# Patient Record
Sex: Female | Born: 1950 | Race: White | Hispanic: No | Marital: Married | Smoking: Never smoker
Health system: Southern US, Community
[De-identification: ages and names within clinical notes are randomized; demographics above are authoritative.]

## PROBLEM LIST (undated history)

## (undated) HISTORY — PX: EYE SURGERY: SHX253

---

## 2019-08-06 ENCOUNTER — Other Ambulatory Visit: Payer: Self-pay

## 2019-08-06 ENCOUNTER — Emergency Department (HOSPITAL_COMMUNITY)
Admission: EM | Admit: 2019-08-06 | Discharge: 2019-08-06 | Disposition: A | Discharge status: Home / Self Care | Payer: Medicare Other | Attending: Emergency Medicine | Admitting: Emergency Medicine

## 2019-08-06 ENCOUNTER — Emergency Department (HOSPITAL_COMMUNITY): Payer: Medicare Other

## 2019-08-06 ENCOUNTER — Encounter (HOSPITAL_COMMUNITY): Payer: Self-pay | Admitting: *Deleted

## 2019-08-06 DIAGNOSIS — W108XXA Fall (on) (from) other stairs and steps, initial encounter: Secondary | ICD-10-CM | POA: Insufficient documentation

## 2019-08-06 DIAGNOSIS — M25551 Pain in right hip: Secondary | ICD-10-CM | POA: Diagnosis not present

## 2019-08-06 DIAGNOSIS — Y93E2 Activity, laundry: Secondary | ICD-10-CM | POA: Diagnosis not present

## 2019-08-06 DIAGNOSIS — M545 Low back pain: Secondary | ICD-10-CM | POA: Diagnosis not present

## 2019-08-06 DIAGNOSIS — Y92008 Other place in unspecified non-institutional (private) residence as the place of occurrence of the external cause: Secondary | ICD-10-CM | POA: Insufficient documentation

## 2019-08-06 DIAGNOSIS — Y999 Unspecified external cause status: Secondary | ICD-10-CM | POA: Insufficient documentation

## 2019-08-06 DIAGNOSIS — S0181XA Laceration without foreign body of other part of head, initial encounter: Secondary | ICD-10-CM | POA: Diagnosis not present

## 2019-08-06 DIAGNOSIS — Z23 Encounter for immunization: Secondary | ICD-10-CM | POA: Insufficient documentation

## 2019-08-06 DIAGNOSIS — W19XXXA Unspecified fall, initial encounter: Secondary | ICD-10-CM

## 2019-08-06 DIAGNOSIS — S0003XA Contusion of scalp, initial encounter: Secondary | ICD-10-CM

## 2019-08-06 MED ORDER — BACITRACIN ZINC 500 UNIT/GM EX OINT
TOPICAL_OINTMENT | Freq: Two times a day (BID) | CUTANEOUS | Status: DC
  Administered 2019-08-06: 1 via TOPICAL
  Filled 2019-08-06: qty 0.9

## 2019-08-06 MED ORDER — ACETAMINOPHEN 325 MG PO TABS
650.0000 mg | ORAL_TABLET | Freq: Once | ORAL | Status: AC
  Administered 2019-08-06: 650 mg via ORAL
  Filled 2019-08-06: qty 2

## 2019-08-06 MED ORDER — METHOCARBAMOL 500 MG PO TABS: 500.0000 mg | ORAL_TABLET | Freq: Three times a day (TID) | ORAL | 0 refills | Status: AC

## 2019-08-06 MED ORDER — TETANUS-DIPHTH-ACELL PERTUSSIS 5-2.5-18.5 LF-MCG/0.5 IM SUSP
0.5000 mL | Freq: Once | INTRAMUSCULAR | Status: AC
  Administered 2019-08-06: 0.5 mL via INTRAMUSCULAR
  Filled 2019-08-06: qty 0.5

## 2019-08-06 NOTE — ED Triage Notes (Signed)
Pt tripped going down her stairs and tumbled down 9 stairs.  No loc.  Denies cervical tenderness.  C-collar placed in triage.  Multiple hematomas to head and c/o R lower back pain. PERRL.

## 2019-08-06 NOTE — Discharge Instructions (Addendum)
You were seen in the ER for a fall.  You have several contusions on your scalp and forehead.  You have a small area of bruising in the lower back.  Scans today and x-rays done were normal.  Anticipate some generalized soreness from a fall tomorrow.  Alternate ibuprofen or acetaminophen every 6-8 hours as needed.  Ice.  Have given you prescription for a muscle relaxer that you can use for any associated catching or spasms in your muscle.  You have a small laceration on your forehead.  This was already well adhered and attached together.  There was no need for sutures.  Keep this wound clean and dry, monitor for signs of infection including redness, warmth, fever, pus.  We discussed possibility of concussion.  Any person with a head injury is at risk of developing a concussion.  Read attached information on concussion.   Concussion symptoms include include headache, nausea, sleep disturbance, dizziness, short term memory loss, vision disturbances, nausea, vomiting, light sensitivity.  Symptom may persist and slowly improve in 2-3 weeks, usually they resolve sooner in less than 1 week. These symptoms can wax and wane, and can be worsened by physical or cognitive exertion like reading, watching TV, studying, using computer, playing video games.   Take acetaminophen or ibuprofen for associated pain. Ice your scalp. Ondansetron 4 mg for nausea, as needed.    We recommend physical and cognitive rest for the next 48-72 hours. This means avoid any heavy exertional or cognitive (brain) activity during this time, that may worsen your symptoms, including reading, computer use, playing video games, watching TV, exercising, jumping, playing sports, high stress work environments.  You may return to work slowly after 48-72 hour rest period if your symptoms have resolved.  If your symptoms have not resolved after this 48-72 hour rest period, you will need to follow up with your primary care doctor or concussion or sports  specialist for re-evaluation and to determine if you can return to work or if you need more tests or longer rest period.  The most important part of concussion management is avoiding a repeat head injury, do not perform any activities that will put you at risk for a second head injury. If you are an athlete, you must be cleared by a clinician before fully returning to sports.   Return to the emergency department if you develop mental status change, lethargy, vomiting, vision changes, confusion, severe or worsening headaches, seizures, weakness or numbness involving any part of your body.

## 2019-08-06 NOTE — ED Provider Notes (Signed)
[facility] EMERGENCY DEPARTMENT Provider Note   CSN: [id number] Arrival date & time: 08/06/19  1235     History   Chief Complaint Chief Complaint  Patient presents with  . Fall    HPI Stefanie Walker is a 68 y.o. female presents to the ER for evaluation of mechanical fall that occurred earlier today.  She was carrying laundry down her steps when she missed a step and fell down approximately 9 stairs.  States 1st of her 4 stairs that turned to the left at a 90 degree and she fell down all the way to the bottom.  Reports head trauma.  She mostly fell on her right buttock and rolled sideways down to the bottom of the steps.  Her husband heard the noise and was helping her up.  There was no LOC.  She has local tenderness to the areas of swelling on her scalp and forehead, mild low back pain and right buttock pain.  She has a small laceration in the forehead.  Denies severe sudden headache, vision changes, neck pain, chest pain, shortness of breath, abdominal pain or other extremity injury.  She received Tylenol in triage with mild improvement in her pain.  No anticoagulants.     HPI  History reviewed. No pertinent past medical history.  There are no active problems to display for this patient.   Past Surgical History:  Procedure Laterality Date  . EYE SURGERY       OB History   No obstetric history on file.      Home Medications    Prior to Admission medications   Medication Sig Start Date End Date Taking? Authorizing Provider  methocarbamol (ROBAXIN) 500 MG tablet Take 1 tablet (500 mg total) by mouth 3 (three) times daily for 5 days. 08/06/19 08/11/19  Liberty Handy, PA-C    Family History No family history on file.  Social History Social History   Tobacco Use  . Smoking status: Never Smoker  . Smokeless tobacco: Never Used  Substance Use Topics  . Alcohol use: Yes    Comment: occ  . Drug use: Never     Allergies   Ciprofloxacin    Review of Systems Review of Systems  Musculoskeletal: Positive for arthralgias and back pain.       Head injury   Skin: Positive for wound.  All other systems reviewed and are negative.    Physical Exam Updated Vital Signs BP (!) 146/66   Pulse 61   Temp 97.7 F (36.5 C) (Oral)   Resp 16   Ht 5' 3.75" (1.619 m)   Wt 81.6 kg   SpO2 99%   BMI 31.14 kg/m   Physical Exam Vitals signs and nursing note reviewed.  Constitutional:      General: She is not in acute distress.    Appearance: She is well-developed.     Comments: NAD.  HENT:     Head: Normocephalic. Contusion and laceration present.      Comments: Contusion, tenderness and ecchymosis on the left forehead, coronal scalp, left occipital and right occipital scalp.  Contusions have overlying abrasions and rug burn but no lacerations. 1 cm laceration to mid forehead, edges already approximated, hemostatic.     Right Ear: External ear normal.     Left Ear: External ear normal.     Ears:     Comments: No periauricular ecchymosis.  No otorrhea.    Nose: Nose normal.     Comments: No nasal  bone tenderness.  Septum is midline.  No epistaxis.  Midface is stable and nontender.    Mouth/Throat:     Comments: No intraoral or tongue injury.  Dentition is stable.  Full range of motion of mandible bilaterally without pain. Eyes:     General: No scleral icterus.    Conjunctiva/sclera: Conjunctivae normal.     Comments: Ecchymosis/contusion on left forehead, left lateral temporal bone.  Some ecchymosis is spreading down to the lateral zygomatic bone that is minimally tender.  Neck:     Musculoskeletal: Normal range of motion and neck supple.     Comments: In cervical collar.  No midline or paraspinal muscle tenderness.  Full range of motion of the neck without any pain.  Trachea midline. Cardiovascular:     Rate and Rhythm: Normal rate and regular rhythm.     Heart sounds: Normal heart sounds.  Pulmonary:     Effort: Pulmonary  effort is normal.     Breath sounds: Normal breath sounds.     Comments: No A/P/L thorax tenderness or abnormalities on the skin, ecchymosis. Musculoskeletal: Normal range of motion.        General: No tenderness or deformity.       Back:     Comments: T-spine: No midline or paraspinal muscle tenderness.  Skin is normal the thoracic spine without ecchymosis or contusions or abrasions. L-spine: No midline or paraspinal muscle tenderness.  Small ecchymosis along lower lumbar midline spine but nontender. Pelvis: Pelvis is stable.  No focal bony tenderness to bony prominences of the hips bilaterally.  No instability with AP/lateral compression.  Full range of motion of hips without any pain. Active range of motion of upper and lower extremities without any pain.  Skin:    General: Skin is warm and dry.     Capillary Refill: Capillary refill takes less than 2 seconds.  Neurological:     Mental Status: She is alert and oriented to person, place, and time.     Comments:  Alert and oriented to self, place, time and event.  Speech is fluent without dysarthria or dysphasia. Strength 5/5 with hand grip and ankle F/E.   Sensation to light touch intact in face, hands and feet. Sits on side of the bed without truncal sway No pronator drift. No leg drop. Normal finger-to-nose and feet tapping.  CN I not tested. CN II-XII intact bilaterally.   Psychiatric:        Behavior: Behavior normal.        Thought Content: Thought content normal.        Judgment: Judgment normal.      ED Treatments / Results  Labs (all labs ordered are listed, but only abnormal results are displayed) Labs Reviewed - No data to display  EKG None  Radiology Dg Lumbar Spine Complete  Result Date: 08/06/2019 CLINICAL DATA:  Fall, right lower back pain EXAM: LUMBAR SPINE - COMPLETE 4+ VIEW COMPARISON:  None. FINDINGS: Slight levocurvature of the lumbar spine. There is trace anterolisthesis at L3-L4. Vertebral body  heights are maintained. Multilevel disc space narrowing, greatest at L4-L5 where there is vacuum disc phenomenon and prominent endplate osteophytes. Facet hypertrophy is present at lower levels. No acute fractures identified. IMPRESSION: No acute fracture identified.  Multilevel degenerative changes. Electronically Signed   By: Macy Mis M.D.   On: 08/06/2019 14:29   Ct Head Wo Contrast  Result Date: 08/06/2019 CLINICAL DATA:  Facial trauma EXAM: CT HEAD WITHOUT CONTRAST TECHNIQUE: Contiguous axial images  were obtained from the base of the skull through the vertex without intravenous contrast. COMPARISON:  None. FINDINGS: Brain: There is no acute intracranial hemorrhage, mass effect, or edema. Gray-white differentiation is preserved. Ventricles and sulci are within normal limits in size and configuration. There is no extra-axial fluid collection Vascular: Unremarkable. Skull: Calvarium is intact. Sinuses/Orbits: Aerated.  Unremarkable. Other: Right parietal, vertex, and left anterior frontal scalp soft swelling. IMPRESSION: No evidence of acute intracranial injury. Electronically Signed   By: Guadlupe SpanishPraneil  Patel M.D.   On: 08/06/2019 14:50   Ct Cervical Spine Wo Contrast  Result Date: 08/06/2019 CLINICAL DATA:  Fall EXAM: CT CERVICAL SPINE WITHOUT CONTRAST TECHNIQUE: Multidetector CT imaging of the cervical spine was performed without intravenous contrast. Multiplanar CT image reconstructions were also generated. COMPARISON:  None. FINDINGS: Alignment: Non-specific straightening of the cervical lordosis. Skull base and vertebrae: Vertebral body heights are maintained. There is no acute fracture. Soft tissues and spinal canal: No prevertebral fluid or swelling. No visible canal hematoma. Disc levels: Degenerative changes are present primarily at C4-C5 and C5-C6 with resulting mild stenosis. Upper chest: No apical mass. Other: None. IMPRESSION: No acute cervical spine fracture. Electronically Signed   By:  Guadlupe SpanishPraneil  Patel M.D.   On: 08/06/2019 14:53   Dg Knee Complete 4 Views Right  Result Date: 08/06/2019 CLINICAL DATA:  Patient status post fall. EXAM: RIGHT KNEE - COMPLETE 4+ VIEW COMPARISON:  None. FINDINGS: Normal anatomic alignment. No evidence for acute fracture or dislocation. Regional soft tissues are unremarkable. IMPRESSION: No acute osseous abnormality. Electronically Signed   By: Annia Beltrew  Davis M.D.   On: 08/06/2019 14:28   Dg Hip Unilat W Or Wo Pelvis 2-3 Views Right  Result Date: 08/06/2019 CLINICAL DATA:  Low back pain. EXAM: DG HIP (WITH OR WITHOUT PELVIS) 2-3V RIGHT COMPARISON:  No recent prior. FINDINGS: Diffuse osteopenia. Degenerative changes lumbar spine and both hips. No acute bony or joint abnormality identified. No evidence of fracture dislocation. IMPRESSION: Diffuse osteopenia. Degenerative changes lumbar spine and both hips. No acute abnormality. Electronically Signed   By: Maisie Fushomas  Register   On: 08/06/2019 14:29    Procedures Procedures (including critical care time)  Medications Ordered in ED Medications  bacitracin ointment (1 application Topical Given 08/06/19 1747)  acetaminophen (TYLENOL) tablet 650 mg (650 mg Oral Given 08/06/19 1310)  Tdap (BOOSTRIX) injection 0.5 mL (0.5 mLs Intramuscular Given 08/06/19 1741)     Initial Impression / Assessment and Plan / ED Course  I have reviewed the triage vital signs and the nursing notes.  Pertinent labs & imaging results that were available during my care of the patient were reviewed by me and considered in my medical decision making (see chart for details).  68 y.o. yo female here after mechanical fall.    HD stable on arrival.  Alert.  She has several contusions on scalp, midline lumbar ecchymosis but exam is surprisingly very reassuring.  No findings to suggest severe CT L-spine, chest, abdominal or other extremity injury.  She is a very small laceration on the forehead that is already hemostatic, edges are well  approximated.  I do not think she needs stitches for this.  Imaging obtained in triage, reviewed and interpreted by me.  Several nonacute/nontraumatic findings noted on CT L-spine.  Discussed these findings with patient.    She is appropriate for discharge.  She is concerned about concussion.  Discussed anybody with head injury is at risk of developing concussion symptoms.  Provided education on symptoms to  monitor for that would warrant formal sports clinic/concussion clinic evaluation.  Will DC with NSAIDs, Robaxin as needed for associated spasms, ice.  Return precautions given. Pt in agreement with ER tx and discharge plan.    Final Clinical Impressions(s) / ED Diagnoses   Final diagnoses:  Fall, initial encounter  Laceration of forehead, initial encounter  Contusion of scalp, initial encounter    ED Discharge Orders         Ordered    methocarbamol (ROBAXIN) 500 MG tablet  3 times daily     08/06/19 1754           Jerrell Mylar 08/06/19 1759    Tilden Fossa, MD 08/07/19 [phone]

## 2021-01-28 IMAGING — CT CT HEAD W/O CM
4 series · 16 of 47 positions shown, 18 images · non-contrast
Comparison: None.

CLINICAL DATA: Facial trauma

EXAM:
CT HEAD WITHOUT CONTRAST
TECHNIQUE: Contiguous axial images were obtained from the base of the skull
through the vertex without intravenous contrast.

[Series 1: head wo · axial · 0.46mm/px · z∈[-105,+20]mm · 7 of 35 slices shown, 9 images]
[im 5/35  brain]
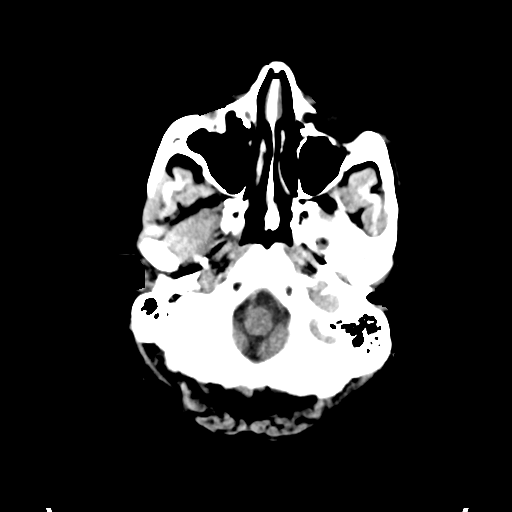
[im 5/35  bone]
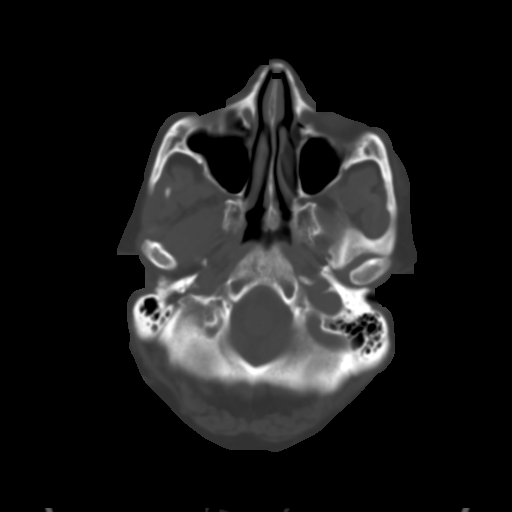
[im 9/35  brain]
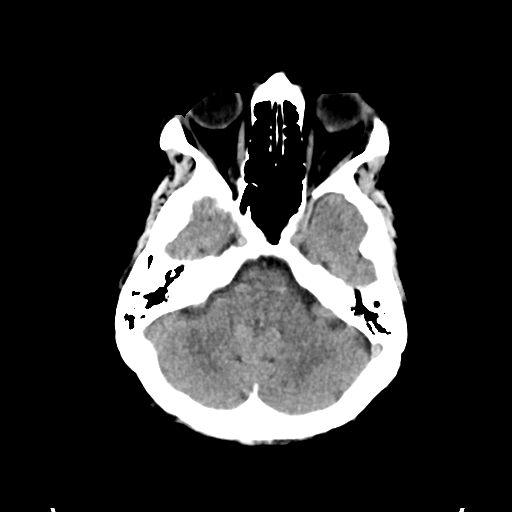
[im 13/35  brain]
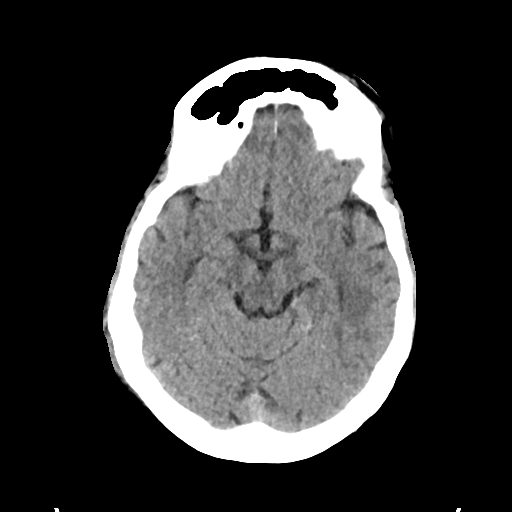
[im 18/35  brain]
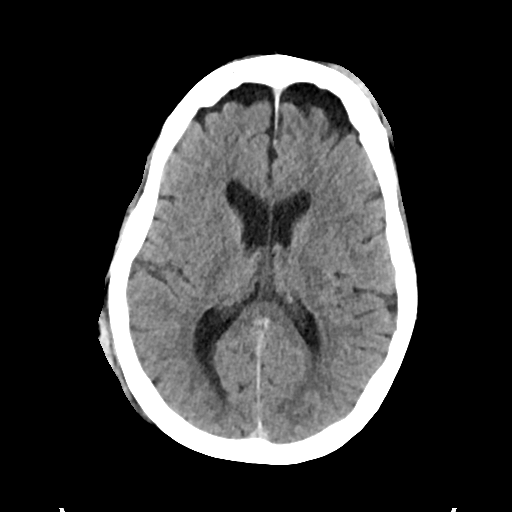
[im 22/35  brain]
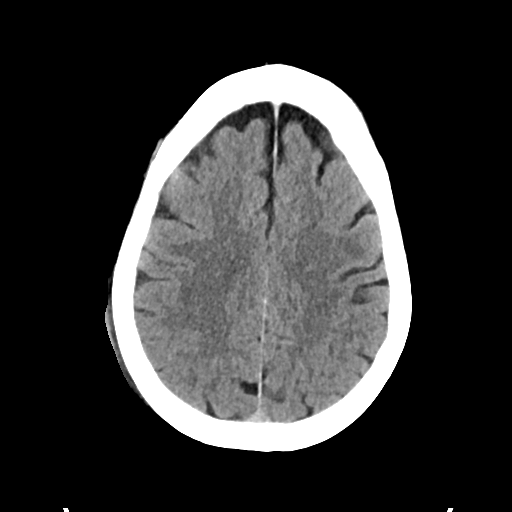
[im 22/35  bone]
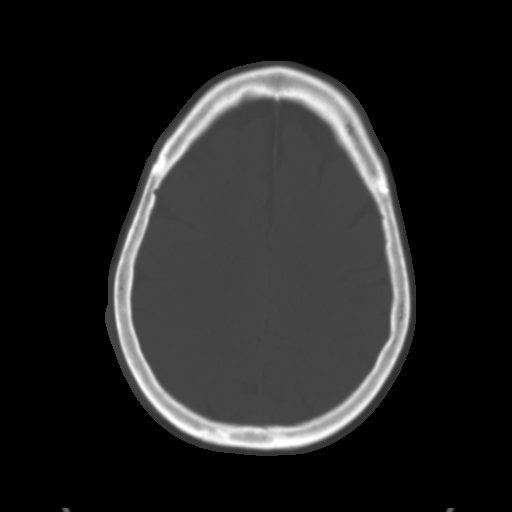
[im 26/35  brain]
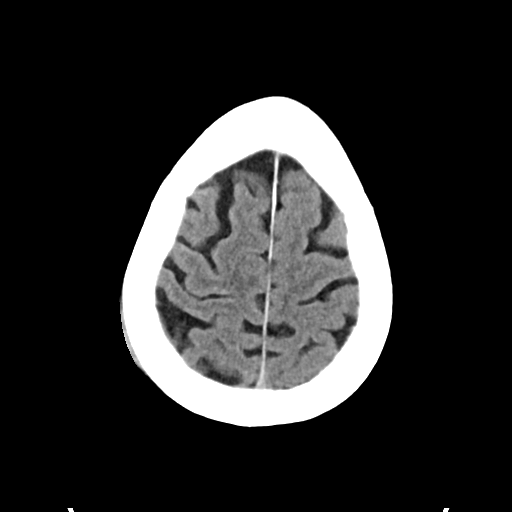
[im 30/35  brain]
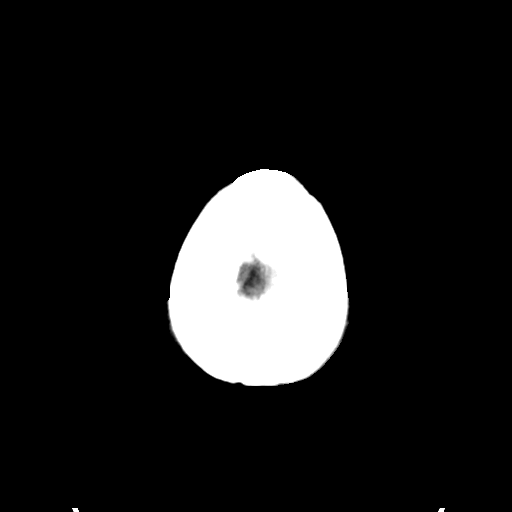

[Series 4: head bone · axial · 0.46mm/px · z∈[-109,-75]mm · 3 of 87 slices shown]
[im 9/87  bone]
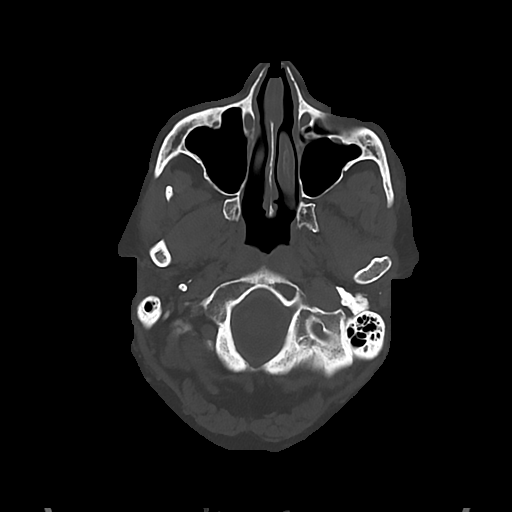
[im 18/87  bone]
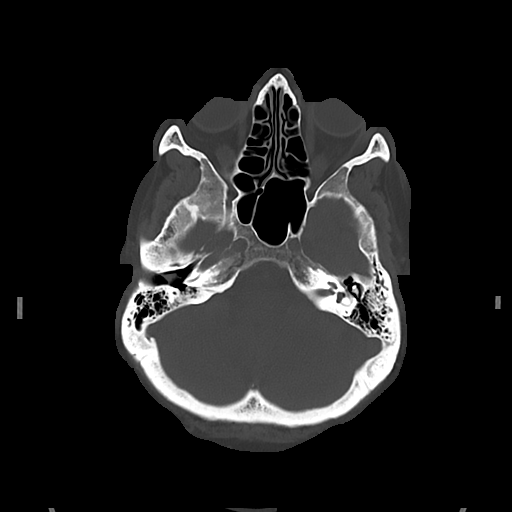
[im 26/87  bone]
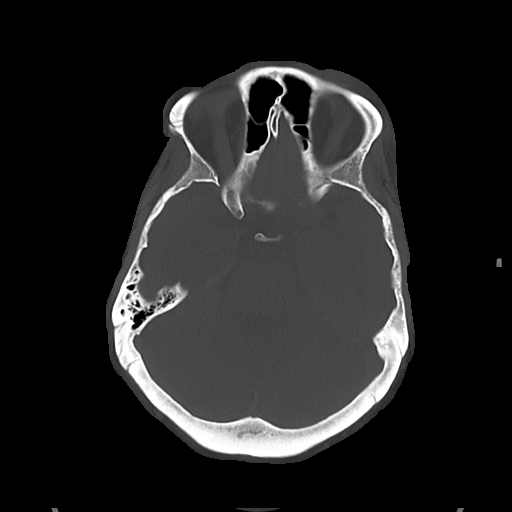

[Series 5: cor soft · coronal · 0.32mm/px · 3 of 71 slices shown]
[im 24/71  brain]
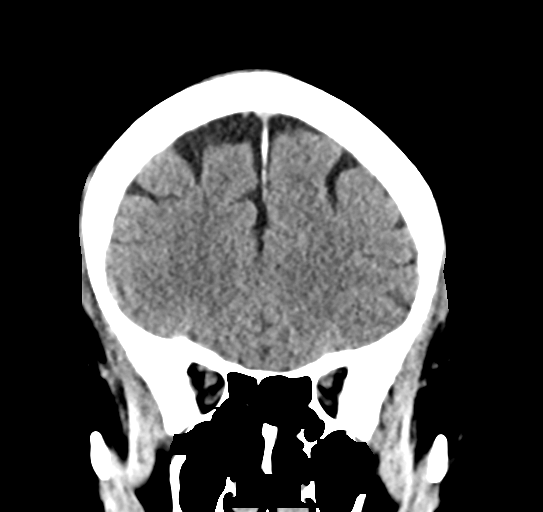
[im 32/71  brain]
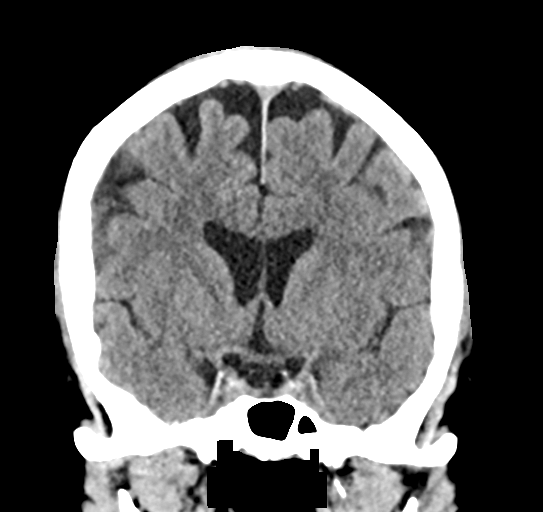
[im 39/71  brain]
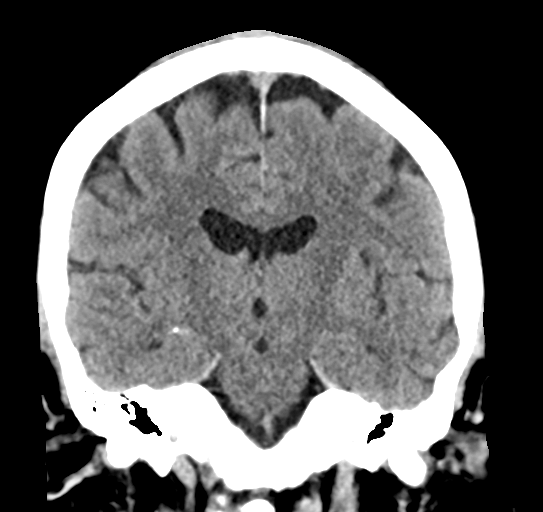

[Series 6: sag soft · sagittal · 0.32mm/px · 3 of 59 slices shown]
[im 20/59  brain]
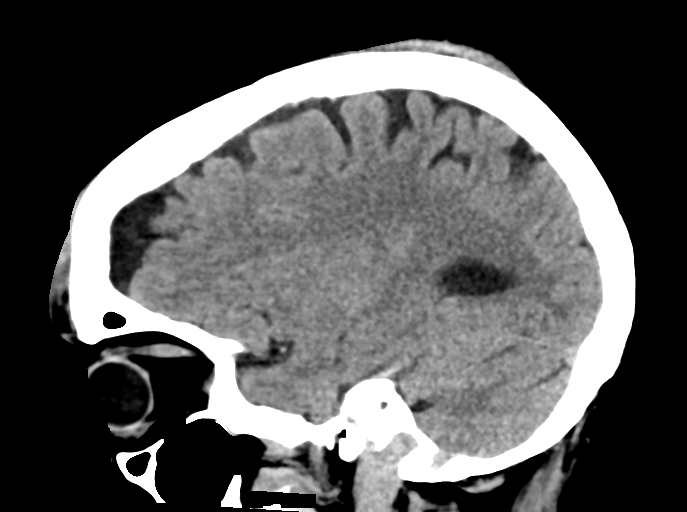
[im 30/59  brain]
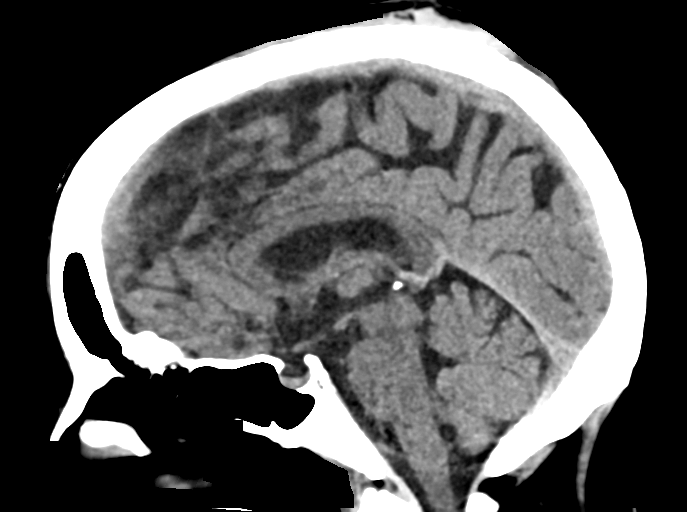
[im 39/59  brain]
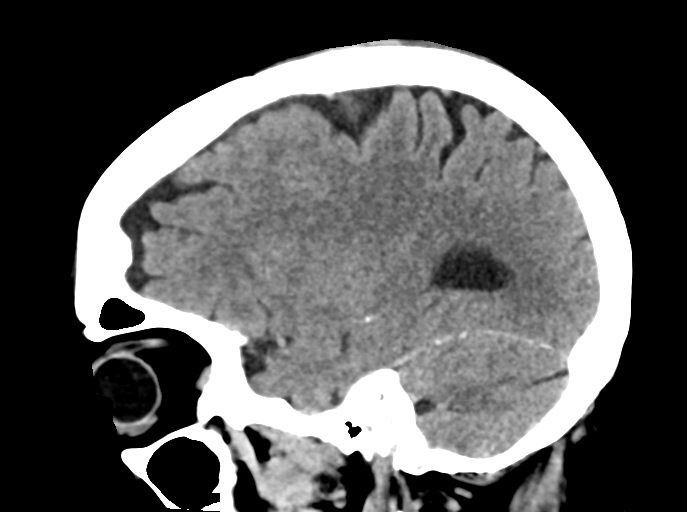

[16 of 47 positions shown; findings below may reference images not displayed]

FINDINGS: Brain: There is no acute intracranial hemorrhage, mass effect, or
edema. Gray-white differentiation is preserved. Ventricles and sulci
are within normal limits in size and configuration. There is no
extra-axial fluid collection

Vascular: Unremarkable.

Skull: Calvarium is intact.

Sinuses/Orbits: Aerated.  Unremarkable.

Other: Right parietal, vertex, and left anterior frontal scalp soft
swelling.
IMPRESSION: No evidence of acute intracranial injury.

## 2021-01-28 IMAGING — DX DG KNEE COMPLETE 4+V*R*
4 series · 4 of 4 positions shown · non-contrast
Comparison: None.

CLINICAL DATA: Patient status post fall.

EXAM:
RIGHT KNEE - COMPLETE 4+ VIEW

[knee ap]
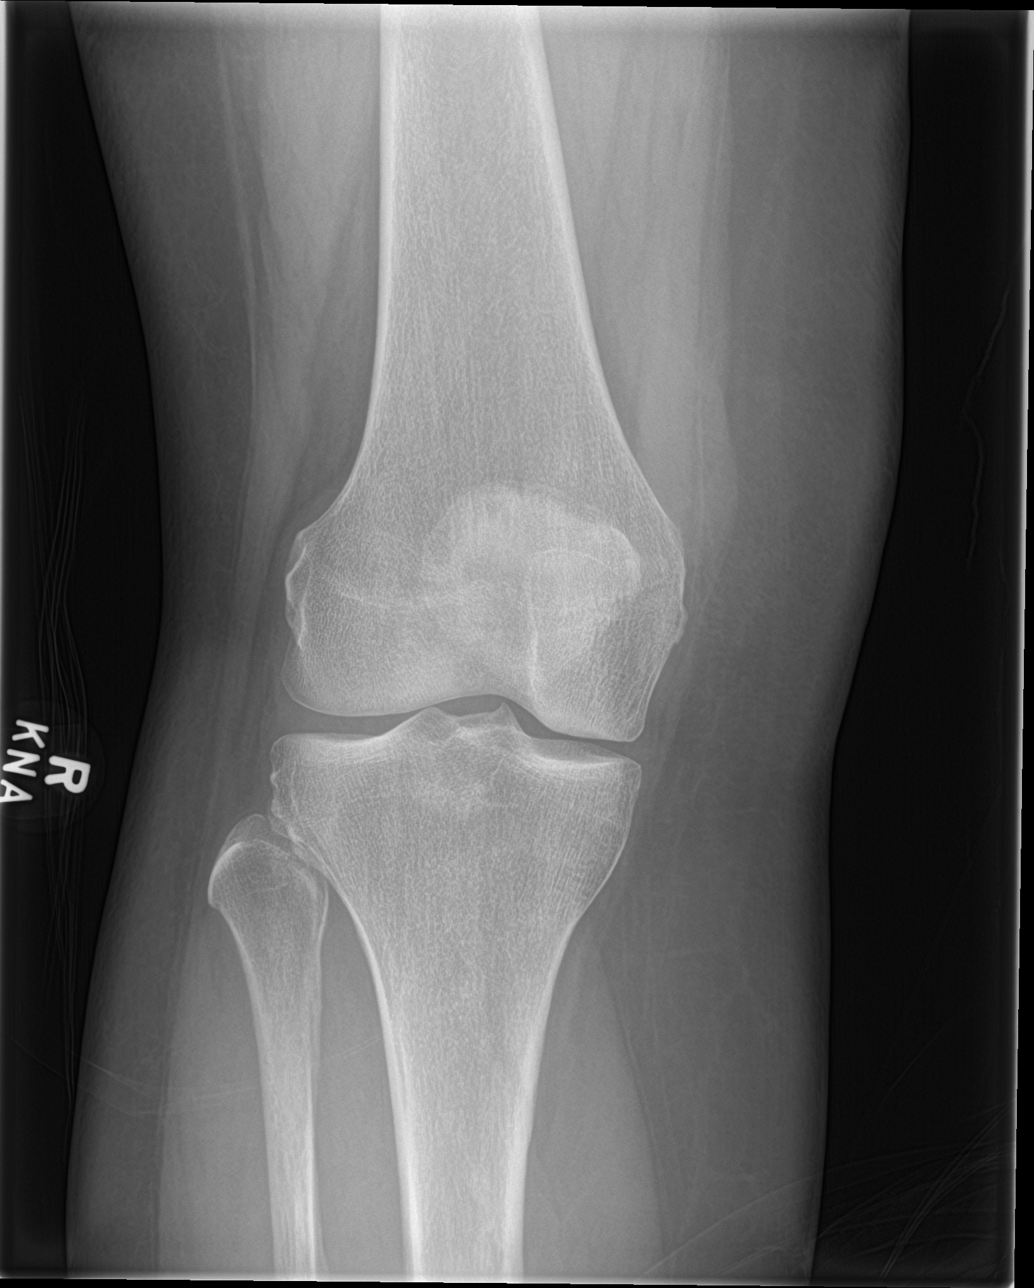

[knee lat]
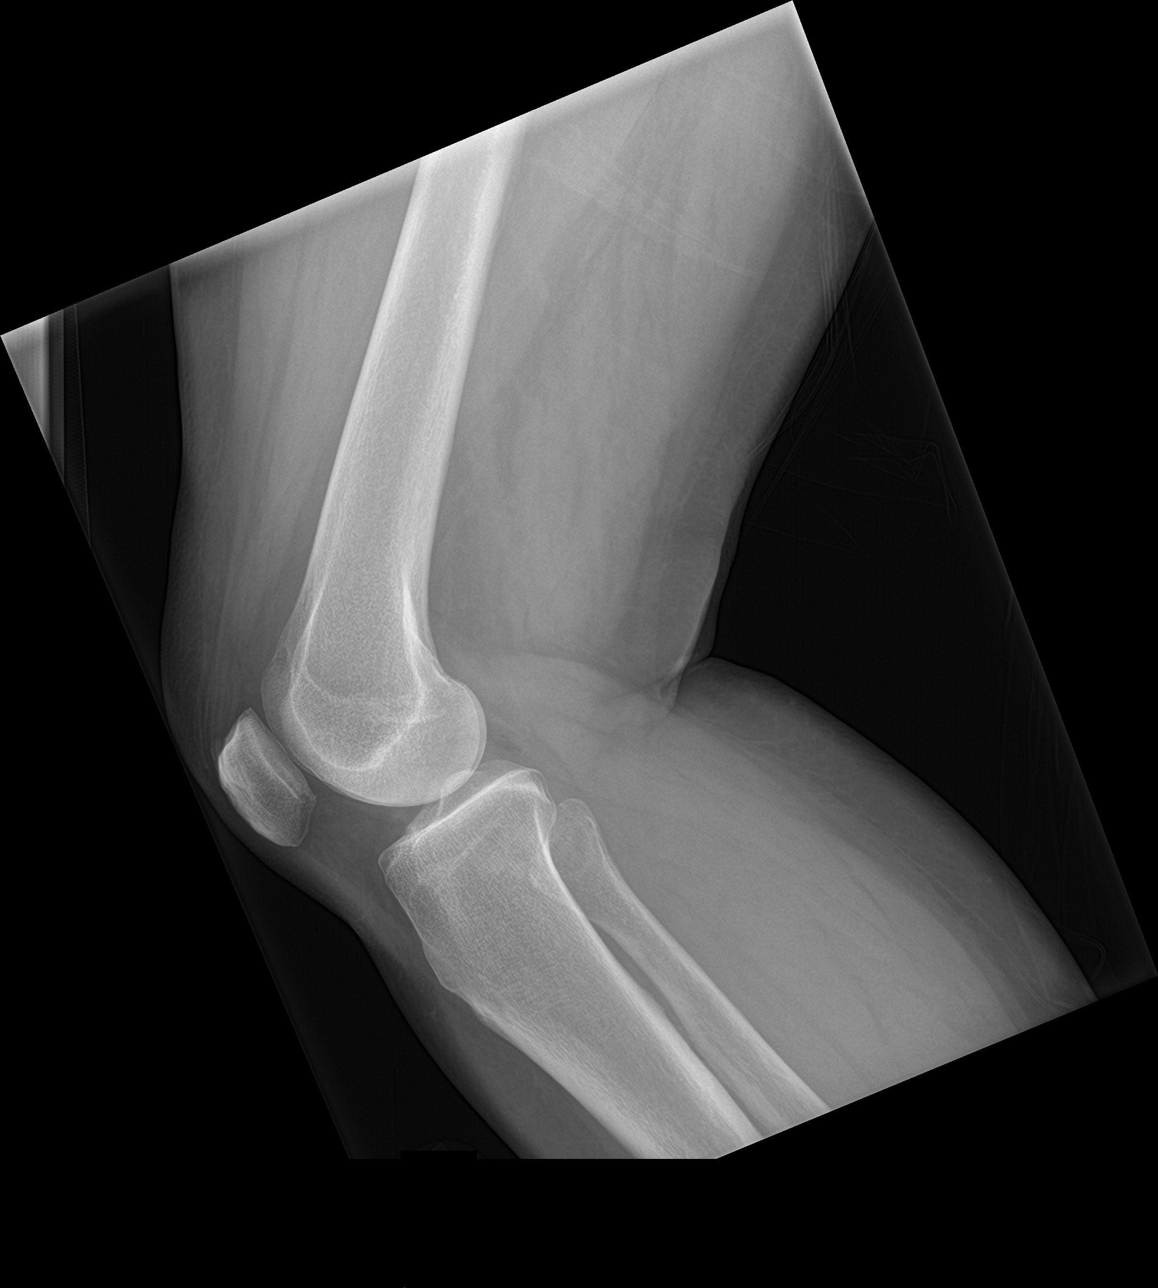

[knee obl (1 of 2)]
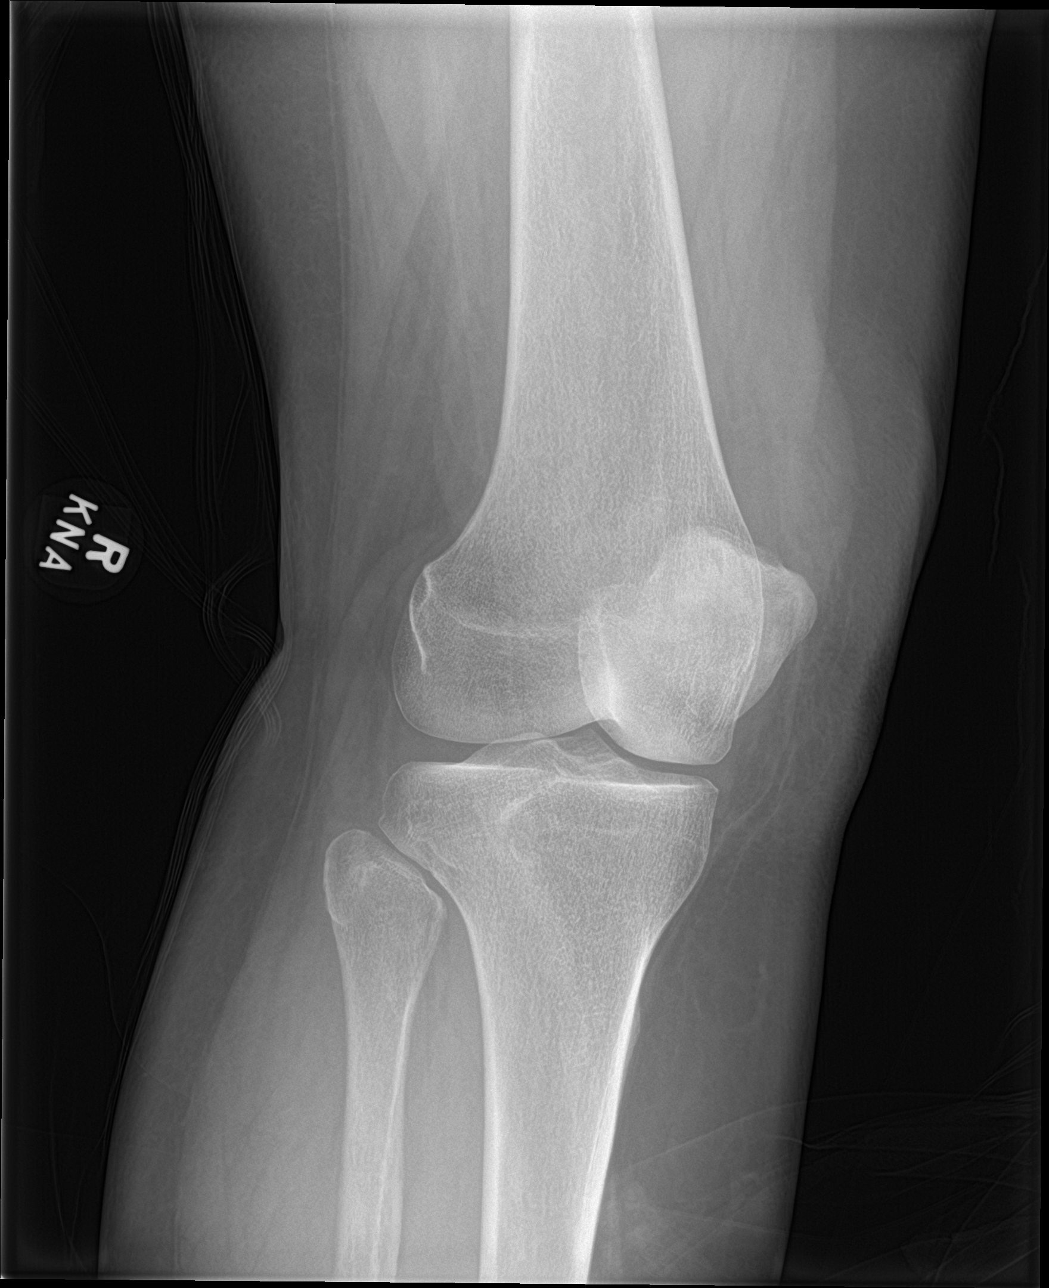

[knee obl (2 of 2)]
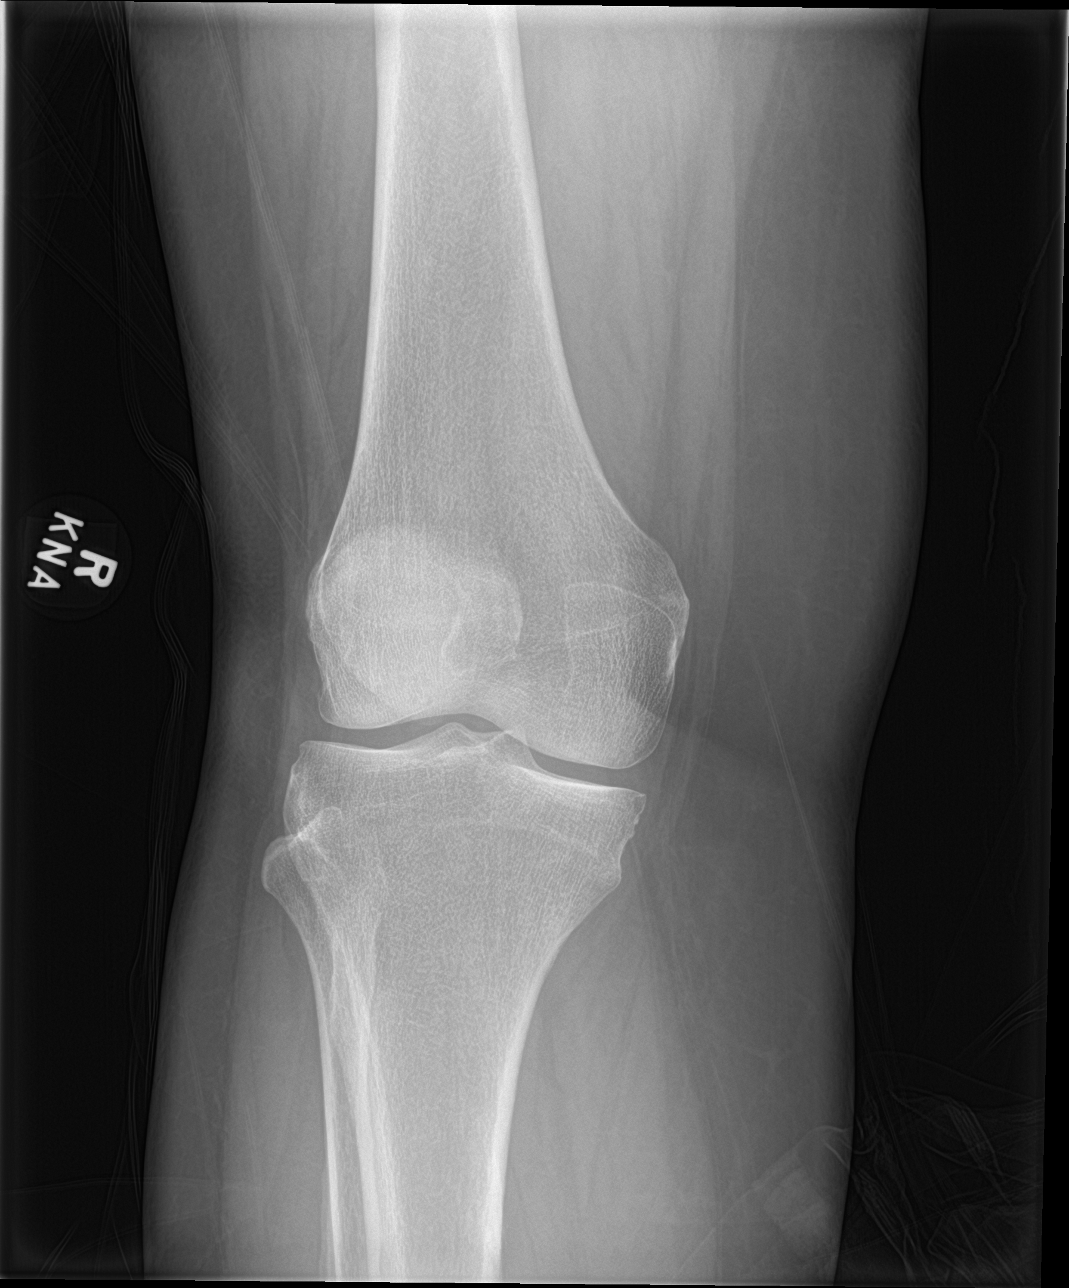

[4 of 4 positions shown; findings below may reference images not displayed]

FINDINGS: Normal anatomic alignment. No evidence for acute fracture or
dislocation. Regional soft tissues are unremarkable.
IMPRESSION: No acute osseous abnormality.
